# Patient Record
Sex: Female | Born: 1965 | Race: White | Hispanic: No | Marital: Married | Smoking: Never smoker
Health system: Southern US, Community
[De-identification: ages and names within clinical notes are randomized; demographics above are authoritative.]

## PROBLEM LIST (undated history)

## (undated) DIAGNOSIS — R51 Headache: Secondary | ICD-10-CM

## (undated) DIAGNOSIS — M84376A Stress fracture, unspecified foot, initial encounter for fracture: Secondary | ICD-10-CM

## (undated) DIAGNOSIS — I10 Essential (primary) hypertension: Secondary | ICD-10-CM

## (undated) DIAGNOSIS — K219 Gastro-esophageal reflux disease without esophagitis: Secondary | ICD-10-CM

## (undated) DIAGNOSIS — E559 Vitamin D deficiency, unspecified: Secondary | ICD-10-CM

## (undated) DIAGNOSIS — M1990 Unspecified osteoarthritis, unspecified site: Secondary | ICD-10-CM

## (undated) DIAGNOSIS — R519 Headache, unspecified: Secondary | ICD-10-CM

## (undated) DIAGNOSIS — E785 Hyperlipidemia, unspecified: Secondary | ICD-10-CM

## (undated) DIAGNOSIS — N135 Crossing vessel and stricture of ureter without hydronephrosis: Secondary | ICD-10-CM

## (undated) HISTORY — PX: BREAST SURGERY: SHX581

## (undated) HISTORY — PX: TONSILLECTOMY: SUR1361

---

## 1997-08-22 HISTORY — PX: REDUCTION MAMMAPLASTY: SUR839

## 2000-08-22 HISTORY — PX: BREAST BIOPSY: SHX20

## 2004-09-24 ENCOUNTER — Ambulatory Visit: Payer: Self-pay | Admitting: General Surgery

## 2004-10-05 ENCOUNTER — Ambulatory Visit: Payer: Self-pay | Admitting: General Surgery

## 2005-04-04 ENCOUNTER — Ambulatory Visit: Payer: Self-pay | Admitting: General Surgery

## 2005-10-26 ENCOUNTER — Ambulatory Visit: Payer: Self-pay | Admitting: General Surgery

## 2006-12-28 ENCOUNTER — Inpatient Hospital Stay: Payer: Self-pay | Admitting: Unknown Physician Specialty

## 2007-10-10 ENCOUNTER — Ambulatory Visit: Payer: Self-pay | Admitting: General Surgery

## 2008-03-25 ENCOUNTER — Ambulatory Visit: Payer: Self-pay | Admitting: Unknown Physician Specialty

## 2008-07-28 ENCOUNTER — Emergency Department: Payer: Self-pay | Admitting: Emergency Medicine

## 2011-02-05 ENCOUNTER — Emergency Department: Payer: Self-pay | Admitting: Emergency Medicine

## 2012-03-19 ENCOUNTER — Emergency Department: Payer: Self-pay | Admitting: *Deleted

## 2012-03-19 LAB — CBC
MCH: 29.1 pg (ref 26.0–34.0)
Platelet: 277 10*3/uL (ref 150–440)
RBC: 5.21 10*6/uL — ABNORMAL HIGH (ref 3.80–5.20)
RDW: 14.2 % (ref 11.5–14.5)
WBC: 7.9 10*3/uL (ref 3.6–11.0)

## 2012-03-19 LAB — COMPREHENSIVE METABOLIC PANEL
BUN: 9 mg/dL (ref 7–18)
Bilirubin,Total: 0.7 mg/dL (ref 0.2–1.0)
Chloride: 101 mmol/L (ref 98–107)
Co2: 29 mmol/L (ref 21–32)
Creatinine: 0.58 mg/dL — ABNORMAL LOW (ref 0.60–1.30)
EGFR (African American): >60
EGFR (Non-African Amer.): >60
Osmolality: 276 (ref 275–301)
Potassium: 2.9 mmol/L — ABNORMAL LOW (ref 3.5–5.1)
SGOT(AST): 83 U/L — ABNORMAL HIGH (ref 15–37)
SGPT (ALT): 69 U/L
Sodium: 139 mmol/L (ref 136–145)
Total Protein: 7.6 g/dL (ref 6.4–8.2)

## 2012-03-19 LAB — URINALYSIS, COMPLETE
Bilirubin,UR: NEGATIVE
Ph: 6 (ref 4.5–8.0)
Protein: 100
Specific Gravity: 1.026 (ref 1.003–1.030)
Squamous Epithelial: 12

## 2012-03-21 LAB — URINE CULTURE

## 2015-05-25 ENCOUNTER — Other Ambulatory Visit: Payer: Self-pay | Admitting: Internal Medicine

## 2015-05-25 DIAGNOSIS — R590 Localized enlarged lymph nodes: Secondary | ICD-10-CM

## 2015-05-28 ENCOUNTER — Ambulatory Visit
Admission: RE | Admit: 2015-05-28 | Discharge: 2015-05-28 | Disposition: A | Discharge status: Home / Self Care | Payer: BLUE CROSS/BLUE SHIELD | Source: Ambulatory Visit | Attending: Internal Medicine | Admitting: Internal Medicine

## 2015-05-28 DIAGNOSIS — R911 Solitary pulmonary nodule: Secondary | ICD-10-CM | POA: Insufficient documentation

## 2015-05-28 DIAGNOSIS — R590 Localized enlarged lymph nodes: Secondary | ICD-10-CM | POA: Insufficient documentation

## 2015-05-28 HISTORY — DX: Essential (primary) hypertension: I10

## 2015-05-28 MED ORDER — IOHEXOL 350 MG/ML SOLN
75.0000 mL | Freq: Once | INTRAVENOUS | Status: AC | PRN
  Administered 2015-05-28: 75 mL via INTRAVENOUS

## 2015-07-21 ENCOUNTER — Encounter: Payer: Self-pay | Admitting: *Deleted

## 2015-07-22 ENCOUNTER — Encounter
Admission: RE | Disposition: A | Discharge status: Home / Self Care | Payer: Self-pay | Source: Ambulatory Visit | Attending: Unknown Physician Specialty

## 2015-07-22 ENCOUNTER — Ambulatory Visit: Payer: BLUE CROSS/BLUE SHIELD | Admitting: Anesthesiology

## 2015-07-22 ENCOUNTER — Encounter: Payer: Self-pay | Admitting: *Deleted

## 2015-07-22 ENCOUNTER — Ambulatory Visit
Admission: RE | Admit: 2015-07-22 | Discharge: 2015-07-22 | Disposition: A | Discharge status: Home / Self Care | Payer: BLUE CROSS/BLUE SHIELD | Source: Ambulatory Visit | Attending: Unknown Physician Specialty | Admitting: Unknown Physician Specialty

## 2015-07-22 DIAGNOSIS — Z79899 Other long term (current) drug therapy: Secondary | ICD-10-CM | POA: Insufficient documentation

## 2015-07-22 DIAGNOSIS — K2970 Gastritis, unspecified, without bleeding: Secondary | ICD-10-CM | POA: Diagnosis not present

## 2015-07-22 DIAGNOSIS — I10 Essential (primary) hypertension: Secondary | ICD-10-CM | POA: Insufficient documentation

## 2015-07-22 DIAGNOSIS — E559 Vitamin D deficiency, unspecified: Secondary | ICD-10-CM | POA: Insufficient documentation

## 2015-07-22 DIAGNOSIS — K640 First degree hemorrhoids: Secondary | ICD-10-CM | POA: Insufficient documentation

## 2015-07-22 DIAGNOSIS — M1990 Unspecified osteoarthritis, unspecified site: Secondary | ICD-10-CM | POA: Diagnosis not present

## 2015-07-22 DIAGNOSIS — Z1211 Encounter for screening for malignant neoplasm of colon: Secondary | ICD-10-CM | POA: Diagnosis not present

## 2015-07-22 DIAGNOSIS — R1310 Dysphagia, unspecified: Secondary | ICD-10-CM | POA: Insufficient documentation

## 2015-07-22 DIAGNOSIS — R51 Headache: Secondary | ICD-10-CM | POA: Diagnosis not present

## 2015-07-22 DIAGNOSIS — K219 Gastro-esophageal reflux disease without esophagitis: Secondary | ICD-10-CM | POA: Insufficient documentation

## 2015-07-22 DIAGNOSIS — E785 Hyperlipidemia, unspecified: Secondary | ICD-10-CM | POA: Diagnosis not present

## 2015-07-22 HISTORY — DX: Crossing vessel and stricture of ureter without hydronephrosis: N13.5

## 2015-07-22 HISTORY — DX: Unspecified osteoarthritis, unspecified site: M19.90

## 2015-07-22 HISTORY — PX: COLONOSCOPY WITH PROPOFOL: SHX5780

## 2015-07-22 HISTORY — DX: Stress fracture, unspecified foot, initial encounter for fracture: M84.376A

## 2015-07-22 HISTORY — DX: Gastro-esophageal reflux disease without esophagitis: K21.9

## 2015-07-22 HISTORY — DX: Headache: R51

## 2015-07-22 HISTORY — DX: Headache, unspecified: R51.9

## 2015-07-22 HISTORY — DX: Vitamin D deficiency, unspecified: E55.9

## 2015-07-22 HISTORY — DX: Hyperlipidemia, unspecified: E78.5

## 2015-07-22 HISTORY — PX: ESOPHAGOGASTRODUODENOSCOPY (EGD) WITH PROPOFOL: SHX5813

## 2015-07-22 SURGERY — COLONOSCOPY WITH PROPOFOL
Anesthesia: General

## 2015-07-22 MED ORDER — FENTANYL CITRATE (PF) 100 MCG/2ML IJ SOLN
INTRAMUSCULAR | Status: DC | PRN
  Administered 2015-07-22: 50 ug via INTRAVENOUS

## 2015-07-22 MED ORDER — GLYCOPYRROLATE 0.2 MG/ML IJ SOLN
INTRAMUSCULAR | Status: DC | PRN
  Administered 2015-07-22: .1 mg via INTRAVENOUS

## 2015-07-22 MED ORDER — PROPOFOL 10 MG/ML IV BOLUS
INTRAVENOUS | Status: DC | PRN
  Administered 2015-07-22: 50 mg via INTRAVENOUS

## 2015-07-22 MED ORDER — PROPOFOL 500 MG/50ML IV EMUL
INTRAVENOUS | Status: DC | PRN
  Administered 2015-07-22: 140 ug/kg/min via INTRAVENOUS

## 2015-07-22 MED ORDER — SODIUM CHLORIDE 0.9 % IV SOLN: INTRAVENOUS | Status: DC

## 2015-07-22 MED ORDER — MIDAZOLAM HCL 5 MG/5ML IJ SOLN
INTRAMUSCULAR | Status: DC | PRN
  Administered 2015-07-22: 1 mg via INTRAVENOUS

## 2015-07-22 MED ORDER — SODIUM CHLORIDE 0.9 % IV SOLN
INTRAVENOUS | Status: DC
  Administered 2015-07-22: 14:00:00 via INTRAVENOUS

## 2015-07-22 NOTE — Anesthesia Postprocedure Evaluation (Signed)
Anesthesia Post Note  Patient: Robie Ridgeammye S Fenstermacher  Procedure(s) Performed: Procedure(s) (LRB): COLONOSCOPY WITH PROPOFOL (N/A) ESOPHAGOGASTRODUODENOSCOPY (EGD) WITH PROPOFOL (N/A)  Patient location during evaluation: Endoscopy Anesthesia Type: General Level of consciousness: awake, awake and alert and oriented Pain management: pain level controlled Vital Signs Assessment: post-procedure vital signs reviewed and stable Respiratory status: spontaneous breathing Cardiovascular status: blood pressure returned to baseline Anesthetic complications: no    Last Vitals:  Filed Vitals:   07/22/15 1530 07/22/15 1540  BP: 121/78 122/78  Pulse: 64 65  Temp:    Resp: 14 12    Last Pain: There were no vitals filed for this visit.               Samarie Pinder

## 2015-07-22 NOTE — H&P (Signed)
   Primary Care Physician:  Danella PentonMILLER,MARK F., MD Primary Gastroenterologist:  Dr. Mechele CollinElliott  Pre-Procedure History & Physical: HPI:  Danielle Lane is a 49 y.o. female is here for an endoscopy and colonoscopy.   Past Medical History  Diagnosis Date  . Hypertension   . Headache   . Arthritis   . Hyperlipidemia   . Ureter obstruction   . Vitamin D deficiency   . Stress fracture of foot   . GERD (gastroesophageal reflux disease)     Past Surgical History  Procedure Laterality Date  . Cesarean section    . Breast surgery    . Tonsillectomy      Prior to Admission medications   Medication Sig Start Date End Date Taking? Authorizing Provider  amLODipine (NORVASC) 5 MG tablet Take 5 mg by mouth daily.   Yes Historical Provider, MD  bisoprolol-hydrochlorothiazide (ZIAC) 5-6.25 MG tablet Take 1 tablet by mouth daily.   Yes Historical Provider, MD  etodolac (LODINE) 300 MG capsule Take 300 mg by mouth 2 (two) times daily.   Yes Historical Provider, MD  hydrochlorothiazide (HYDRODIURIL) 25 MG tablet Take 25 mg by mouth daily.   Yes Historical Provider, MD  lovastatin (MEVACOR) 40 MG tablet Take 40 mg by mouth at bedtime.   Yes Historical Provider, MD  pantoprazole (PROTONIX) 40 MG tablet Take 40 mg by mouth 2 (two) times daily.   Yes Historical Provider, MD    Allergies as of 06/25/2015 - never reviewed  Allergen Reaction Noted  . Ace inhibitors Swelling 05/28/2015  . Levaquin [levofloxacin in d5w] Palpitations 05/28/2015    History reviewed. No pertinent family history.  Social History   Social History  . Marital Status: Married    Spouse Name: N/A  . Number of Children: N/A  . Years of Education: N/A   Occupational History  . Not on file.   Social History Main Topics  . Smoking status: Never Smoker   . Smokeless tobacco: Not on file  . Alcohol Use: 0.6 oz/week    1 Glasses of wine per week  . Drug Use: No  . Sexual Activity: Not on file   Other Topics Concern  .  Not on file   Social History Narrative    Review of Systems: See HPI, otherwise negative ROS  Physical Exam: BP 144/93 mmHg  Pulse 106  Temp(Src) 96.8 F (36 C) (Tympanic)  Resp 18  Ht 5\' 4"  (1.626 m)  Wt 97.523 kg (215 lb)  BMI 36.89 kg/m2  SpO2 98%  LMP 04/29/2013 General:   Alert,  pleasant and cooperative in NAD Head:  Normocephalic and atraumatic. Neck:  Supple; no masses or thyromegaly. Lungs:  Clear throughout to auscultation.    Heart:  Regular rate and rhythm. Abdomen:  Soft, nontender and nondistended. Normal bowel sounds, without guarding, and without rebound.   Neurologic:  Alert and  oriented x4;  grossly normal neurologically.  Impression/Plan: Danielle Lane is here for an endoscopy and colonoscopy to be performed for dysphagia and colon cancer screening  Risks, benefits, limitations, and alternatives regarding  endoscopy and colonoscopy have been reviewed with the patient.  Questions have been answered.  All parties agreeable.   Lynnae PrudeELLIOTT, Thresa Dozier, MD  07/22/2015, 2:28 PM

## 2015-07-22 NOTE — Anesthesia Preprocedure Evaluation (Signed)
Anesthesia Evaluation  Patient identified by MRN, date of birth, ID band Patient awake    Reviewed: Allergy & Precautions, NPO status , Patient's Chart, lab work & pertinent test results  Airway Mallampati: II  TM Distance: >3 FB Neck ROM: Full    Dental no notable dental hx.    Pulmonary neg pulmonary ROS,    Pulmonary exam normal breath sounds clear to auscultation       Cardiovascular hypertension, Pt. on medications and Pt. on home beta blockers Normal cardiovascular exam     Neuro/Psych  Headaches, negative psych ROS   GI/Hepatic Neg liver ROS, GERD  Medicated and Controlled,  Endo/Other  negative endocrine ROS  Renal/GU negative Renal ROS  negative genitourinary   Musculoskeletal  (+) Arthritis , Osteoarthritis,    Abdominal Normal abdominal exam  (+)   Peds negative pediatric ROS (+)  Hematology negative hematology ROS (+)   Anesthesia Other Findings   Reproductive/Obstetrics                             Anesthesia Physical Anesthesia Plan  ASA: II  Anesthesia Plan: General   Post-op Pain Management:    Induction: Intravenous  Airway Management Planned: Nasal Cannula  Additional Equipment:   Intra-op Plan:   Post-operative Plan:   Informed Consent: I have reviewed the patients History and Physical, chart, labs and discussed the procedure including the risks, benefits and alternatives for the proposed anesthesia with the patient or authorized representative who has indicated his/her understanding and acceptance.   Dental advisory given  Plan Discussed with: CRNA and Surgeon  Anesthesia Plan Comments:         Anesthesia Quick Evaluation

## 2015-07-22 NOTE — Op Note (Signed)
[facility] Gastroenterology Patient Name: Danielle Lane Procedure Date: 07/22/2015 2:26 PM MRN: [id number] Account #: [id number] Date of Birth: 20-Jan-1966 Admit Type: Outpatient Age: [age] [facility]OM 3 Gender: Female Note Status: Finalized Procedure:         Upper GI endoscopy Indications:       Dysphagia Providers:         Scot Junobert T. Sherrina Zaugg, MD Medicines:         Propofol per Anesthesia Complications:     No immediate complications. Procedure:         Pre-Anesthesia Assessment:                    - After reviewing the risks and benefits, the patient was                     deemed in satisfactory condition to undergo the procedure.                    After obtaining informed consent, the endoscope was passed                     under direct vision. Throughout the procedure, the                     patient's blood pressure, pulse, and oxygen saturations                     were monitored continuously. The Endoscope was introduced                     through the mouth, and advanced to the second part of                     duodenum. The upper GI endoscopy was accomplished without                     difficulty. The patient tolerated the procedure well. Findings:      The examined esophagus was normal. A guidewire was placed and the scope       was withdrawn. Dilation was performed with a Savary dilator with mild       resistance at 17 mm.      Patchy mild inflammation characterized by erythema and granularity was       found in the gastric antrum.      The examined duodenum was normal. Impression:        - Normal esophagus. Dilated.                    - Gastritis.                    - Normal examined duodenum.                    - No specimens collected. Recommendation:    - soft food for 3 days, eat slowly, chew well, take small                     bites Scot Junobert T Beverley Allender, MD 07/22/2015 3:27:46 PM This report has been signed electronically. Number of  Addenda: 0 Note Initiated On: 07/22/2015 2:26 PM      [facility]

## 2015-07-22 NOTE — Transfer of Care (Signed)
Immediate Anesthesia Transfer of Care Note  Patient: Danielle Lane  Procedure(s) Performed: Procedure(s): COLONOSCOPY WITH PROPOFOL (N/A) ESOPHAGOGASTRODUODENOSCOPY (EGD) WITH PROPOFOL (N/A)  Patient Location: PACU and Endoscopy Unit  Anesthesia Type:General  Level of Consciousness: awake, alert  and oriented  Airway & Oxygen Therapy: Patient Spontanous Breathing and Patient connected to nasal cannula oxygen  Post-op Assessment: Report given to RN and Post -op Vital signs reviewed and stable  Post vital signs: Reviewed and stable  Last Vitals:  Filed Vitals:   07/22/15 1338 07/22/15 1513  BP: 144/93   Pulse: 106   Temp: 36 C 36.9 C  Resp: 18     Complications: No apparent anesthesia complications

## 2015-07-22 NOTE — Op Note (Signed)
[facility] Gastroenterology Patient Name: Danielle Lane Procedure Date: 07/22/2015 2:28 PM MRN: [id number] Account #: [id number] Date of Birth: 1966-06-29 Admit Type: Outpatient Age: [age] Room[facility] ROOM 3 Gender: Female Note Status: Finalized Procedure:         Colonoscopy Indications:       Screening for colorectal malignant neoplasm Providers:         Scot Junobert T. Elliott, MD Referring MD:      Danella PentonMark F. Miller, MD (Referring MD) Medicines:         Propofol per Anesthesia Complications:     No immediate complications. Procedure:         Pre-Anesthesia Assessment:                    - After reviewing the risks and benefits, the patient was                     deemed in satisfactory condition to undergo the procedure.                    After obtaining informed consent, the colonoscope was                     passed under direct vision. Throughout the procedure, the                     patient's blood pressure, pulse, and oxygen saturations                     were monitored continuously. The Colonoscope was                     introduced through the anus and advanced to the the cecum,                     identified by appendiceal orifice and ileocecal valve. The                     colonoscopy was performed without difficulty. The patient                     tolerated the procedure well. The quality of the bowel                     preparation was excellent. Findings:      Internal hemorrhoids were found during endoscopy. The hemorrhoids were       small, medium-sized and Grade I (internal hemorrhoids that do not       prolapse).      The exam was otherwise without abnormality. Impression:        - Internal hemorrhoids.                    - The examination was otherwise normal.                    - No specimens collected. Recommendation:    - Perform an upper GI endoscopy as previously scheduled. Scot Junobert T Elliott, MD 07/22/2015 2:56:36 PM This report has  been signed electronically. Number of Addenda: 0 Note Initiated On: 07/22/2015 2:28 PM Scope Withdrawal Time: 0 hours 8 minutes 6 seconds  Total Procedure Duration: 0 hours 16 minutes 48 seconds       [facility]

## 2015-07-27 ENCOUNTER — Encounter: Payer: Self-pay | Admitting: Unknown Physician Specialty

## 2016-06-03 ENCOUNTER — Encounter: Payer: Self-pay | Admitting: Podiatry

## 2016-06-03 ENCOUNTER — Other Ambulatory Visit: Payer: Self-pay | Admitting: *Deleted

## 2016-06-03 ENCOUNTER — Ambulatory Visit (INDEPENDENT_AMBULATORY_CARE_PROVIDER_SITE_OTHER): Payer: BLUE CROSS/BLUE SHIELD | Admitting: Podiatry

## 2016-06-03 VITALS — BP 139/78 | HR 69 | Resp 16

## 2016-06-03 DIAGNOSIS — R234 Changes in skin texture: Secondary | ICD-10-CM | POA: Diagnosis not present

## 2016-06-03 DIAGNOSIS — B353 Tinea pedis: Secondary | ICD-10-CM

## 2016-06-03 MED ORDER — TERBINAFINE HCL 250 MG PO TABS: 250.0000 mg | ORAL_TABLET | Freq: Every day | ORAL | 0 refills | Status: AC

## 2016-06-03 MED ORDER — NAFTIFINE HCL 2 % EX CREA: TOPICAL_CREAM | CUTANEOUS | 2 refills | Status: DC

## 2016-06-03 NOTE — Progress Notes (Signed)
   Subjective:    Patient ID: Danielle Lane, female    DOB: [birth year], 50 y.o.   MRN: [id number]  HPI    Review of Systems  Skin: Positive for rash.  All other systems reviewed and are negative.      Objective:   Physical Exam        Assessment & Plan:

## 2016-06-05 NOTE — Progress Notes (Signed)
Patient ID: Danielle Lane, female   DOB: 11/20/1965, 50 y.o.   MRN: [id number] Subjective:  Patient presents to the office today for evaluation of bilateral cracking of the feet with severe itching. Patient states that she got a pedicure on 04/06/2016 at which time she developed a rash on the bottom of her foot with cracking skin and severe itching and pain. Patient presents for further treatment and evaluation    Objective/Physical Exam General: The patient is alert and oriented x3 in no acute distress.  Dermatology: Hyperkeratotic peeling skin noted to the bilateral plantar feet with fissures extending through the dermal tissue. Skin is warm, dry and supple bilateral lower extremities. Negative for open lesions or macerations.  Vascular: Palpable pedal pulses bilaterally. No edema or erythema noted. Capillary refill within normal limits.  Neurological: Epicritic and protective threshold grossly intact bilaterally.   Musculoskeletal Exam: Range of motion within normal limits to all pedal and ankle joints bilateral. Muscle strength 5/5 in all groups bilateral.   Assessment: #1 tinea pedis bilateral feet secondary to nail pedicure #2 fissures bilateral plantar feet   Plan of Care:  #1 Patient was evaluated. #2 prescription for terbinafine 250 mg 4 weeks was ordered #3 Naftin 2% cream ordered #4 patient is to return to clinic in 4 weeks   Dr. Felecia ShellingBrent M. Nery Frappier, DPM [facility]

## 2016-06-06 ENCOUNTER — Telehealth: Payer: Self-pay | Admitting: *Deleted

## 2016-06-07 NOTE — Telephone Encounter (Signed)
Patient called today said that Dr. Logan BoresEvans saw her last Friday 10/13 and prescribed Naftin Cream 2%. RX was sent to [facility]. The pharmacy told the patient that this RX requires prior approval with her insurance and they said [facility] for this information to be sent to her insurance company. Patient wanted someone to give her a call. I left you a VM message about this.  Thanks!

## 2016-06-07 NOTE — Telephone Encounter (Signed)
I reminded pt that I had spoken with her yesterday, and said the Prior Authorization often took a while before the results came back and once they had returned we would call her and the pharmacy.

## 2016-06-10 ENCOUNTER — Telehealth: Payer: Self-pay | Admitting: *Deleted

## 2016-06-10 NOTE — Telephone Encounter (Signed)
Yes, rx for Lotrisone Cream. Apply daily with 1 refill.

## 2016-06-10 NOTE — Telephone Encounter (Addendum)
Pt states it has been a week and the foot cream has not been approved, would Dr. Logan BoresEvans want to orders something different. 06/13/2016-Informed pt Dr. Logan BoresEvans had ordered an alternative cream.

## 2016-06-13 MED ORDER — CLOTRIMAZOLE-BETAMETHASONE 1-0.05 % EX CREA: 1.0000 "application " | TOPICAL_CREAM | Freq: Two times a day (BID) | CUTANEOUS | 1 refills | Status: AC

## 2016-07-05 ENCOUNTER — Ambulatory Visit: Payer: BLUE CROSS/BLUE SHIELD | Admitting: Podiatry

## 2016-07-11 ENCOUNTER — Telehealth: Payer: Self-pay | Admitting: *Deleted

## 2016-07-11 NOTE — Telephone Encounter (Signed)
[facility] asked if prior authorization had been received for Naftin. Left message informing pharmacy staff the Naftin had been canceled and there was no need for PA.

## 2016-08-02 ENCOUNTER — Other Ambulatory Visit: Payer: Self-pay | Admitting: Obstetrics and Gynecology

## 2016-08-02 DIAGNOSIS — Z1231 Encounter for screening mammogram for malignant neoplasm of breast: Secondary | ICD-10-CM

## 2016-09-09 ENCOUNTER — Ambulatory Visit: Admission: RE | Admit: 2016-09-09 | Payer: BLUE CROSS/BLUE SHIELD | Source: Ambulatory Visit

## 2017-05-24 ENCOUNTER — Other Ambulatory Visit: Payer: Self-pay | Admitting: Internal Medicine

## 2017-05-24 DIAGNOSIS — R911 Solitary pulmonary nodule: Secondary | ICD-10-CM

## 2017-06-02 ENCOUNTER — Ambulatory Visit
Admission: RE | Admit: 2017-06-02 | Discharge: 2017-06-02 | Disposition: A | Discharge status: Home / Self Care | Payer: 59 | Source: Ambulatory Visit | Attending: Internal Medicine | Admitting: Internal Medicine

## 2017-06-02 DIAGNOSIS — K760 Fatty (change of) liver, not elsewhere classified: Secondary | ICD-10-CM | POA: Insufficient documentation

## 2017-06-02 DIAGNOSIS — R911 Solitary pulmonary nodule: Secondary | ICD-10-CM | POA: Diagnosis not present

## 2017-06-02 DIAGNOSIS — I2510 Atherosclerotic heart disease of native coronary artery without angina pectoris: Secondary | ICD-10-CM | POA: Insufficient documentation

## 2017-06-02 DIAGNOSIS — R918 Other nonspecific abnormal finding of lung field: Secondary | ICD-10-CM | POA: Diagnosis not present

## 2017-06-02 MED ORDER — IOPAMIDOL (ISOVUE-300) INJECTION 61%
75.0000 mL | Freq: Once | INTRAVENOUS | Status: AC | PRN
  Administered 2017-06-02: 75 mL via INTRAVENOUS

## 2017-11-21 ENCOUNTER — Other Ambulatory Visit: Payer: Self-pay | Admitting: Obstetrics and Gynecology

## 2017-11-21 DIAGNOSIS — Z1231 Encounter for screening mammogram for malignant neoplasm of breast: Secondary | ICD-10-CM

## 2017-12-19 ENCOUNTER — Ambulatory Visit
Admission: RE | Admit: 2017-12-19 | Discharge: 2017-12-19 | Disposition: A | Discharge status: Home / Self Care | Payer: 59 | Source: Ambulatory Visit | Attending: Obstetrics and Gynecology | Admitting: Obstetrics and Gynecology

## 2017-12-19 DIAGNOSIS — Z1231 Encounter for screening mammogram for malignant neoplasm of breast: Secondary | ICD-10-CM | POA: Diagnosis present

## 2018-09-04 IMAGING — CT CT CHEST W/ CM
1 series · 15 of 34 positions shown, 19 images · IV contrast (iopamidol)
Comparison: 05/28/2015.

CLINICAL DATA: Followup 5 mm superior segment right lower lobe lung
nodule seen on a chest CT dated 05/28/2015.

EXAM:
CT CHEST WITH CONTRAST
TECHNIQUE: Multidetector CT imaging of the chest was performed during
intravenous contrast administration.
CONTRAST:  75mL EBDUDY-MCC IOPAMIDOL (EBDUDY-MCC) INJECTION 61%

[Series 2: axial st · axial · 0.73mm/px · z∈[-699,-407]mm · 15 of 172 slices shown, 19 images]
[im 13/172  mediastinal]
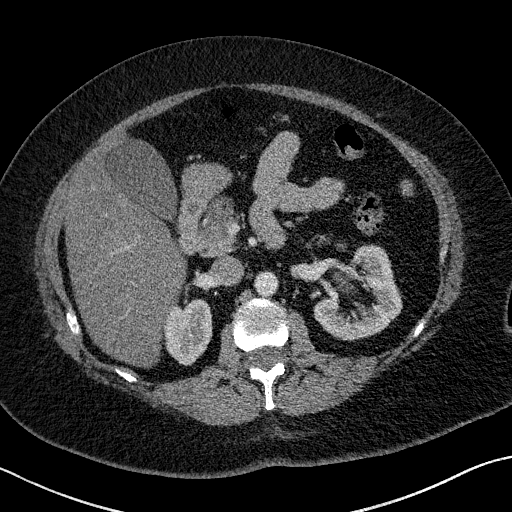
[im 13/172  lung]
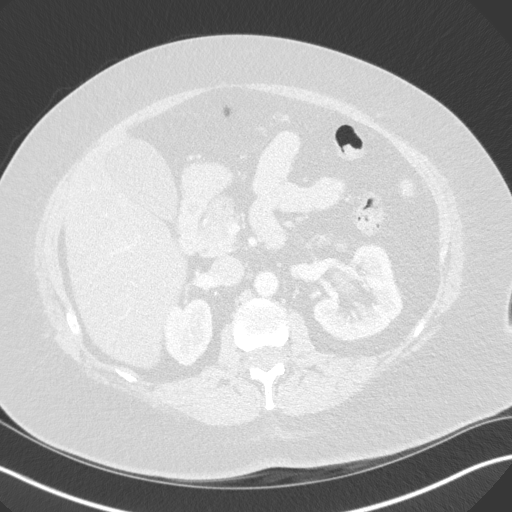
[im 26/172  lung]
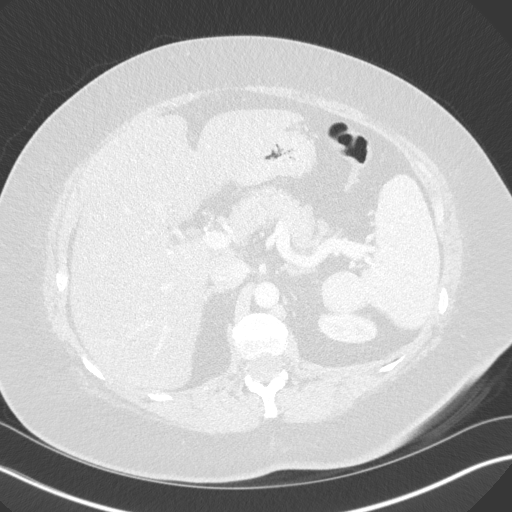
[im 35/172  lung]
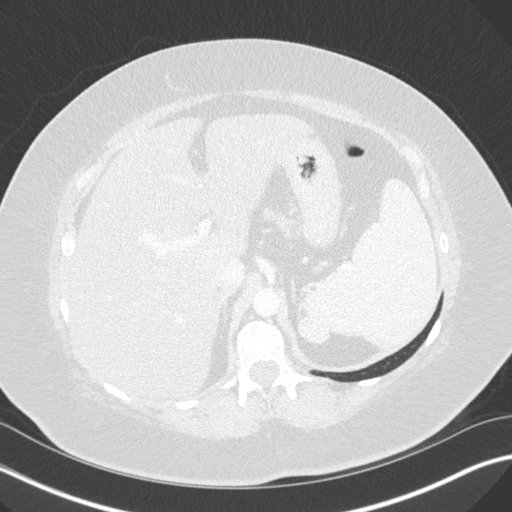
[im 45/172  lung]
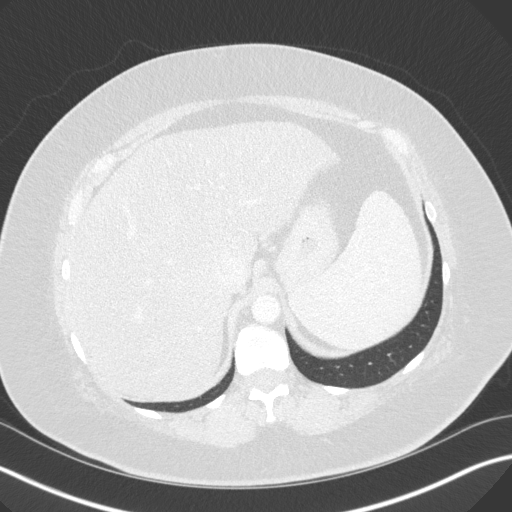
[im 58/172  mediastinal]
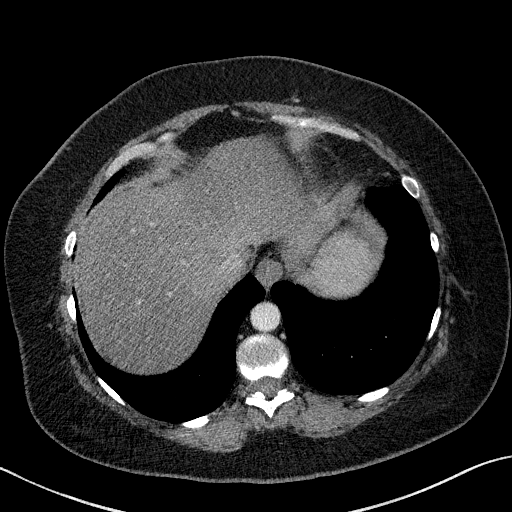
[im 58/172  lung]
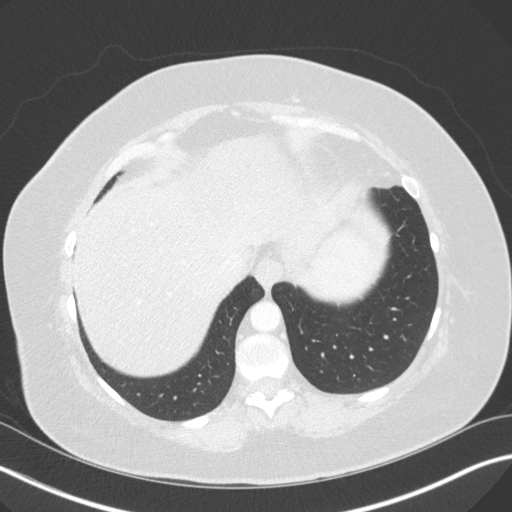
[im 69/172  lung]
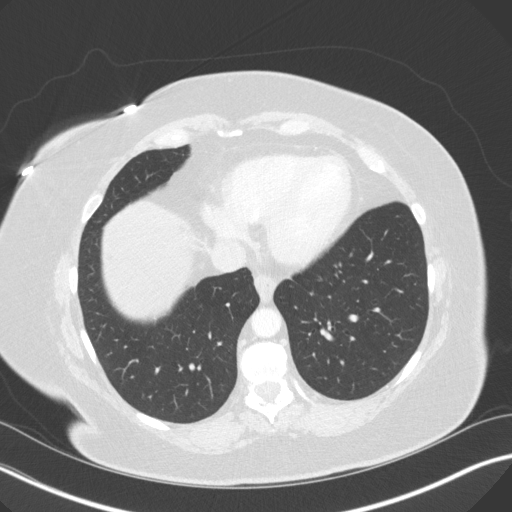
[im 77/172  lung]
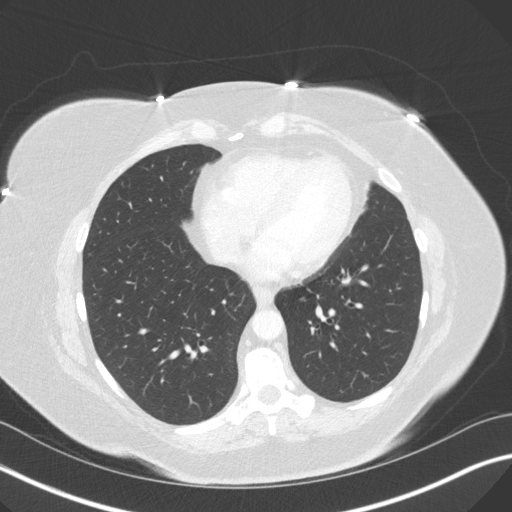
[im 89/172  lung]
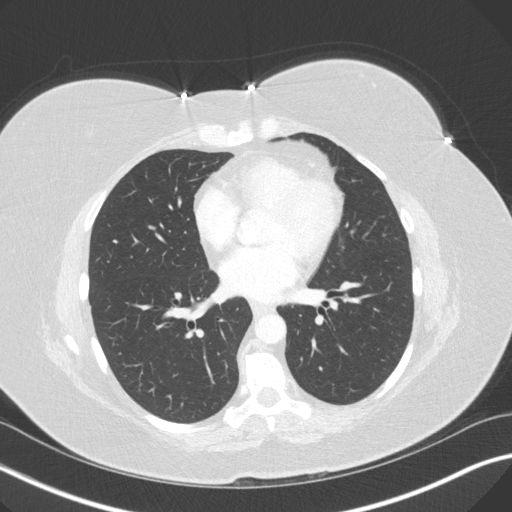
[im 96/172  mediastinal]
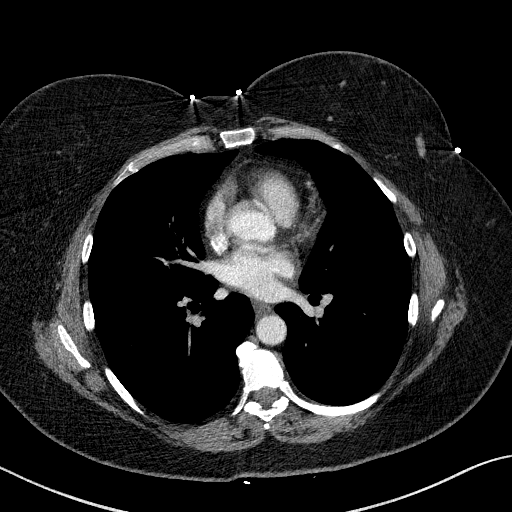
[im 96/172  lung]
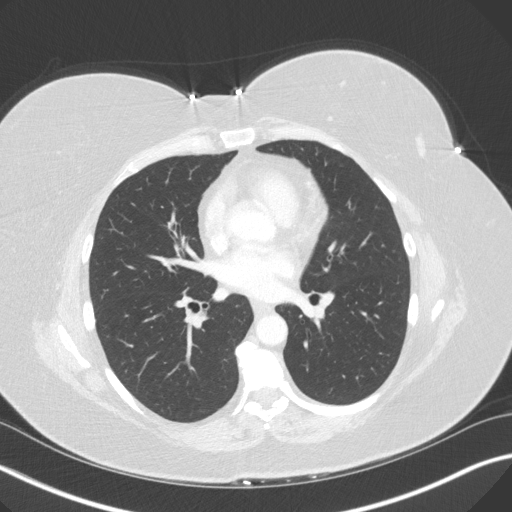
[im 103/172  lung]
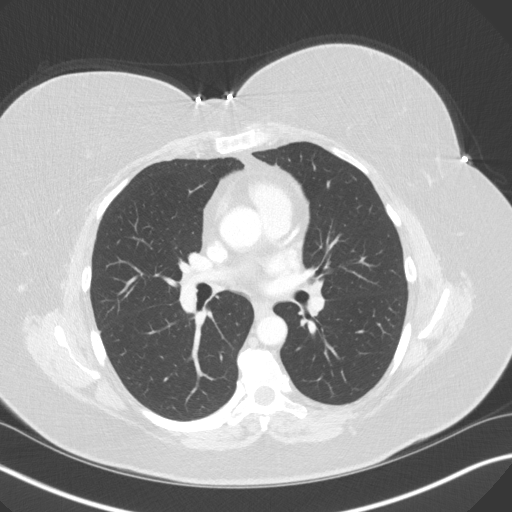
[im 115/172  lung]
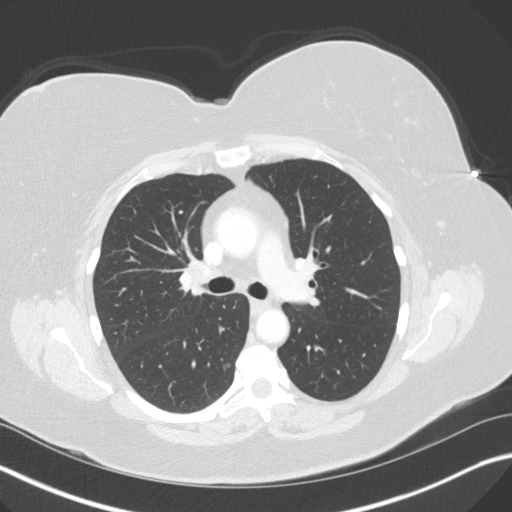
[im 127/172  lung]
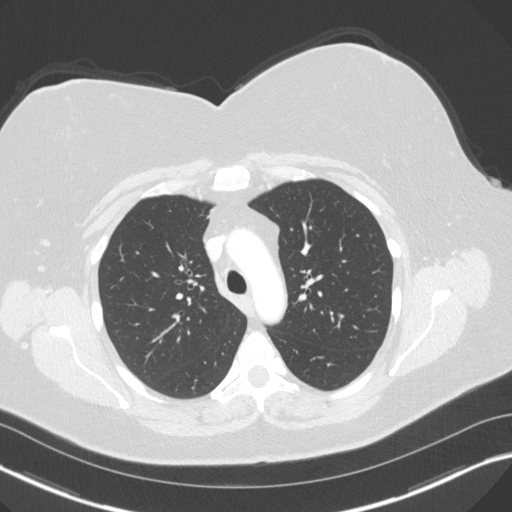
[im 137/172  mediastinal]
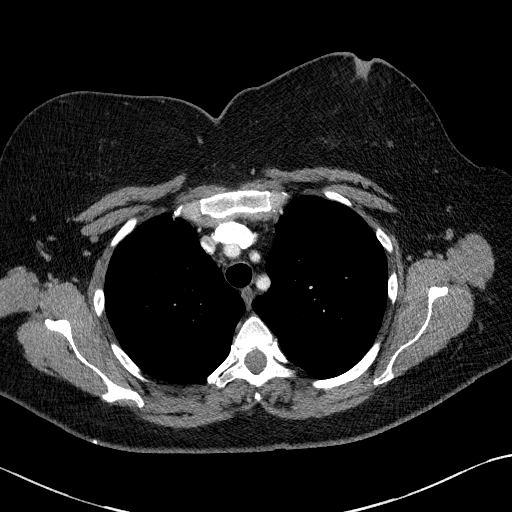
[im 137/172  lung]
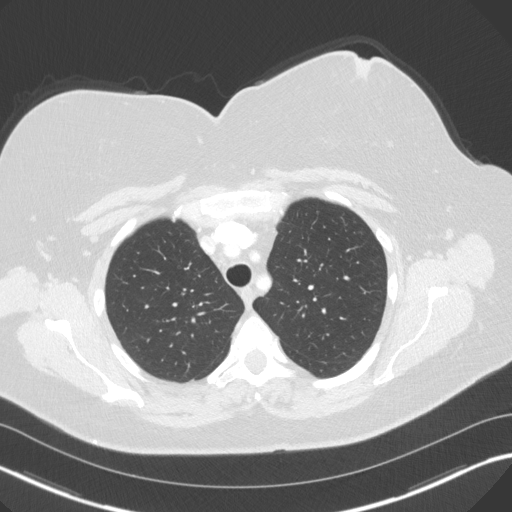
[im 146/172  lung]
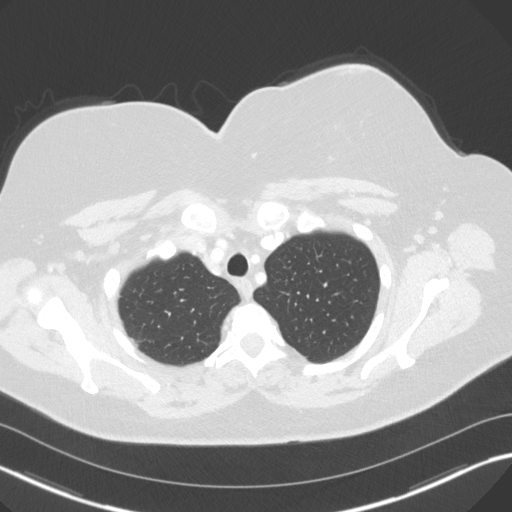
[im 159/172  lung]
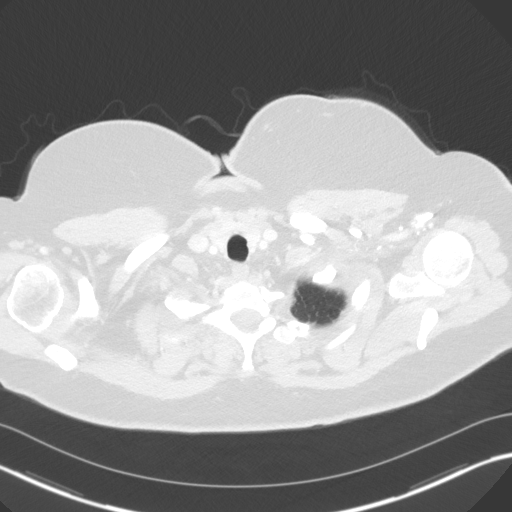

[15 of 34 positions shown; findings below may reference images not displayed]

FINDINGS: Cardiovascular: Atheromatous coronary artery calcifications. Normal
sized heart.

Mediastinum/Nodes: No enlarged mediastinal, hilar, or axillary lymph
nodes. Thyroid gland, trachea, and esophagus demonstrate no
significant findings.

Lungs/Pleura: The previously demonstrated 5 mm superior segment
right lower lobe nodule measures 5 mm in maximum diameter on image
number 57 of series 3. A 4 mm linear nodule in the right upper lobe
on image number 56 of series 3 is smaller. No new lung nodules are
seen.

Upper Abdomen: Diffuse low density of the liver relative to the
spleen. Left renal parapelvic cysts. Right renal parapelvic cyst or
prominent extrarenal pelvis.

Musculoskeletal: Thoracic spine degenerative changes.
IMPRESSION: 1. Stable 5 mm superior segment right lower lobe nodule and interval
decrease in size of a small linear nodule in the right upper lobe,
currently measuring 4 mm in maximum diameter. The stability and
decrease in size of the nodules respectively is compatible with a
benign process.
2. Diffuse hepatic steatosis.
3. Atheromatous coronary artery calcifications.

## 2019-07-23 ENCOUNTER — Ambulatory Visit
Admission: EM | Admit: 2019-07-23 | Discharge: 2019-07-23 | Disposition: A | Discharge status: Home / Self Care | Payer: BC Managed Care – PPO | Attending: Emergency Medicine | Admitting: Emergency Medicine

## 2019-07-23 ENCOUNTER — Encounter: Payer: Self-pay | Admitting: *Deleted

## 2019-07-23 DIAGNOSIS — Z20822 Contact with and (suspected) exposure to covid-19: Secondary | ICD-10-CM

## 2019-07-23 DIAGNOSIS — Z20828 Contact with and (suspected) exposure to other viral communicable diseases: Secondary | ICD-10-CM

## 2019-07-23 NOTE — ED Provider Notes (Signed)
Danielle Lane    CSN: [id number] Arrival date & time: 07/23/19  1739      History   Chief Complaint Chief Complaint  Patient presents with  . COVID Test    HPI Danielle Lane is a 53 y.o. female.   Patient presents with request for a COVID test.  She was exposed to a positive coworker last week.  She is asymptomatic and denies fever, chills, congestion, cough, shortness of breath, vomiting, diarrhea, rash, or other symptoms.  No treatments attempted at home.     The history is provided by the patient.    Past Medical History:  Diagnosis Date  . Arthritis   . GERD (gastroesophageal reflux disease)   . Headache   . Hyperlipidemia   . Hypertension   . Stress fracture of foot   . Ureter obstruction   . Vitamin D deficiency     There are no active problems to display for this patient.   Past Surgical History:  Procedure Laterality Date  . BREAST BIOPSY Left 2002   core bx benign  . BREAST SURGERY    . CESAREAN SECTION    . COLONOSCOPY WITH PROPOFOL N/A 07/22/2015   Procedure: COLONOSCOPY WITH PROPOFOL;  Surgeon: Scot Jun, MD;  Location: [facility] ENDOSCOPY;  Service: Endoscopy;  Laterality: N/A;  . ESOPHAGOGASTRODUODENOSCOPY (EGD) WITH PROPOFOL N/A 07/22/2015   Procedure: ESOPHAGOGASTRODUODENOSCOPY (EGD) WITH PROPOFOL;  Surgeon: Scot Jun, MD;  Location: [facility] ENDOSCOPY;  Service: Endoscopy;  Laterality: N/A;  . REDUCTION MAMMAPLASTY Bilateral 1999  . TONSILLECTOMY      OB History   No obstetric history on file.      Home Medications    Prior to Admission medications   Medication Sig Start Date End Date Taking? Authorizing Provider  albuterol (PROVENTIL HFA;VENTOLIN HFA) 108 (90 Base) MCG/ACT inhaler Inhale into the lungs. 05/04/16   [provider]  amLODipine (NORVASC) 5 MG tablet Take 5 mg by mouth daily.    [provider]  bisoprolol-hydrochlorothiazide (ZIAC) 5-6.25 MG tablet Take 1 tablet by mouth daily.    [provider]  clotrimazole-betamethasone (LOTRISONE) cream Apply 1 application topically 2 (two) times daily. Apply to affected area daily. 06/13/16   Felecia Shelling, DPM  etodolac (LODINE) 300 MG capsule Take 300 mg by mouth 2 (two) times daily.    [provider]  fluticasone (FLONASE) 50 MCG/ACT nasal spray Place into the nose. 05/04/16   [provider]  hydrochlorothiazide (HYDRODIURIL) 25 MG tablet Take 25 mg by mouth daily.    [provider]  HYDROcodone-homatropine (HYCODAN) 5-1.5 MG/5ML syrup Take by mouth. 05/04/16   [provider]  lovastatin (MEVACOR) 40 MG tablet Take 40 mg by mouth at bedtime.    [provider]  pantoprazole (PROTONIX) 40 MG tablet Take 40 mg by mouth 2 (two) times daily.    [provider]  terbinafine (LAMISIL) 250 MG tablet Take 1 tablet (250 mg total) by mouth daily. 06/03/16   Felecia Shelling, DPM    Family History Family History  Problem Relation Age of Onset  . Hypertension Mother   . Thyroid disease Mother   . Hypertension Father   . Breast cancer Neg Hx     Social History Social History   Tobacco Use  . Smoking status: Never Smoker  . Smokeless tobacco: Never Used  Substance Use Topics  . Alcohol use: Yes    Alcohol/week: 1.0 standard drinks    Types: 1  Glasses of wine per week  . Drug use: No     Allergies   Ace inhibitors and Levaquin [levofloxacin in d5w]   Review of Systems Review of Systems  Constitutional: Negative for chills and fever.  HENT: Negative for congestion, ear pain, rhinorrhea and sore throat.   Eyes: Negative for pain and visual disturbance.  Respiratory: Negative for cough and shortness of breath.   Cardiovascular: Negative for chest pain and palpitations.  Gastrointestinal: Negative for abdominal pain, diarrhea, nausea and vomiting.  Genitourinary: Negative for dysuria and hematuria.  Musculoskeletal: Negative for arthralgias and back pain.  Skin:  Negative for color change and rash.  Neurological: Negative for seizures and syncope.  All other systems reviewed and are negative.    Physical Exam Triage Vital Signs ED Triage Vitals  Enc Vitals Group     BP 07/23/19 1748 133/87     Pulse Rate 07/23/19 1748 60     Resp 07/23/19 1748 17     Temp 07/23/19 1748 97.6 F (36.4 C)     Temp Source 07/23/19 1748 Oral     SpO2 07/23/19 1748 99 %     Weight --      Height --      Head Circumference --      Peak Flow --      Pain Score 07/23/19 1741 0     Pain Loc --      Pain Edu? --      Excl. in [place]? --    No data found.  Updated Vital Signs BP 133/87 (BP Location: Left Arm)   Pulse 60   Temp 97.6 F (36.4 C) (Oral)   Resp 17   LMP 04/29/2013   SpO2 99%   Visual Acuity Right Eye Distance:   Left Eye Distance:   Bilateral Distance:    Right Eye Near:   Left Eye Near:    Bilateral Near:     Physical Exam Vitals signs and nursing note reviewed.  Constitutional:      General: She is not in acute distress.    Appearance: She is well-developed. She is not ill-appearing.  HENT:     Head: Normocephalic and atraumatic.     Right Ear: Tympanic membrane normal.     Left Ear: Tympanic membrane normal.     Nose: Nose normal.     Mouth/Throat:     Mouth: Mucous membranes are moist.     Pharynx: Oropharynx is clear.  Eyes:     Conjunctiva/sclera: Conjunctivae normal.  Neck:     Musculoskeletal: Neck supple.  Cardiovascular:     Rate and Rhythm: Normal rate and regular rhythm.     Heart sounds: No murmur.  Pulmonary:     Effort: Pulmonary effort is normal. No respiratory distress.     Breath sounds: Normal breath sounds.  Abdominal:     General: Bowel sounds are normal.     Palpations: Abdomen is soft.     Tenderness: There is no abdominal tenderness. There is no guarding or rebound.  Skin:    General: Skin is warm and dry.     Findings: No rash.  Neurological:     General: No focal deficit present.     Mental  Status: She is alert and oriented to person, place, and time.      UC Treatments / Results  Labs (all labs ordered are listed, but only abnormal results are displayed) Labs Reviewed  NOVEL CORONAVIRUS, NAA    EKG  Radiology No results found.  Procedures Procedures (including critical care time)  Medications Ordered in UC Medications - No data to display  Initial Impression / Assessment and Plan / UC Course  I have reviewed the triage vital signs and the nursing notes.  Pertinent labs & imaging results that were available during my care of the patient were reviewed by me and considered in my medical decision making (see chart for details).    Patient request for COVID test after exposure at work.  COVID test performed here.  Instructed patient to self quarantine until the test result is back.  Instructed patient to go to the emergency department if she develops high fever, shortness of breath, severe diarrhea, or other concerning symptoms.  Patient agrees with plan of care.      Final Clinical Impressions(s) / UC Diagnoses   Final diagnoses:  Exposure to COVID-19 virus     Discharge Instructions     Your COVID test is pending.  You should self quarantine until your test result is back and is negative.    Go to the emergency department if you develop high fever, shortness of breath, severe diarrhea, or other concerning symptoms.       ED Prescriptions    None     PDMP not reviewed this encounter.   Mickie Bailate, Kniyah Khun H, NP 07/23/19 [phone]

## 2019-07-23 NOTE — ED Triage Notes (Signed)
Patient with positive exposure to COVID last Wednesday. No symptoms, would like to be tested.

## 2019-07-23 NOTE — Discharge Instructions (Addendum)
Your COVID test is pending.  You should self quarantine until your test result is back and is negative.   ° °Go to the emergency department if you develop high fever, shortness of breath, severe diarrhea, or other concerning symptoms.   ° °

## 2019-07-26 LAB — NOVEL CORONAVIRUS, NAA: SARS-CoV-2, NAA: NOT DETECTED

## 2022-02-25 ENCOUNTER — Ambulatory Visit: Admission: RE | Admit: 2022-02-25 | Payer: BC Managed Care – PPO | Source: Home / Self Care

## 2022-02-25 ENCOUNTER — Encounter: Admission: RE | Payer: Self-pay | Source: Home / Self Care

## 2022-02-25 SURGERY — EGD (ESOPHAGOGASTRODUODENOSCOPY)
Anesthesia: General

## 2022-12-08 ENCOUNTER — Other Ambulatory Visit: Payer: Self-pay | Admitting: Internal Medicine

## 2022-12-08 DIAGNOSIS — Z1231 Encounter for screening mammogram for malignant neoplasm of breast: Secondary | ICD-10-CM

## 2023-01-05 ENCOUNTER — Ambulatory Visit
Admission: RE | Admit: 2023-01-05 | Discharge: 2023-01-05 | Disposition: A | Discharge status: Home / Self Care | Payer: BC Managed Care – PPO | Source: Ambulatory Visit | Attending: Internal Medicine | Admitting: Internal Medicine

## 2023-01-05 DIAGNOSIS — Z1231 Encounter for screening mammogram for malignant neoplasm of breast: Secondary | ICD-10-CM
